# Patient Record
Sex: Male | Born: 2000 | Hispanic: Yes | Marital: Single | Smoking: Heavy tobacco smoker
Health system: Southern US, Community
[De-identification: ages and names within clinical notes are randomized; demographics above are authoritative.]

---

## 2020-10-11 ENCOUNTER — Other Ambulatory Visit: Payer: Self-pay

## 2020-10-11 ENCOUNTER — Emergency Department: Payer: No Typology Code available for payment source

## 2020-10-11 ENCOUNTER — Emergency Department
Admission: EM | Admit: 2020-10-11 | Discharge: 2020-10-11 | Disposition: A | Discharge status: Home / Self Care | Payer: No Typology Code available for payment source | Attending: Emergency Medicine | Admitting: Emergency Medicine

## 2020-10-11 DIAGNOSIS — S40011A Contusion of right shoulder, initial encounter: Secondary | ICD-10-CM | POA: Diagnosis not present

## 2020-10-11 DIAGNOSIS — R457 State of emotional shock and stress, unspecified: Secondary | ICD-10-CM | POA: Diagnosis not present

## 2020-10-11 DIAGNOSIS — T07XXXA Unspecified multiple injuries, initial encounter: Secondary | ICD-10-CM

## 2020-10-11 DIAGNOSIS — S7001XA Contusion of right hip, initial encounter: Secondary | ICD-10-CM | POA: Insufficient documentation

## 2020-10-11 DIAGNOSIS — R238 Other skin changes: Secondary | ICD-10-CM | POA: Insufficient documentation

## 2020-10-11 DIAGNOSIS — S1093XA Contusion of unspecified part of neck, initial encounter: Secondary | ICD-10-CM | POA: Insufficient documentation

## 2020-10-11 DIAGNOSIS — F17210 Nicotine dependence, cigarettes, uncomplicated: Secondary | ICD-10-CM | POA: Diagnosis not present

## 2020-10-11 DIAGNOSIS — S5011XA Contusion of right forearm, initial encounter: Secondary | ICD-10-CM | POA: Diagnosis not present

## 2020-10-11 DIAGNOSIS — Y92410 Unspecified street and highway as the place of occurrence of the external cause: Secondary | ICD-10-CM | POA: Insufficient documentation

## 2020-10-11 DIAGNOSIS — S20221A Contusion of right back wall of thorax, initial encounter: Secondary | ICD-10-CM | POA: Insufficient documentation

## 2020-10-11 DIAGNOSIS — R4589 Other symptoms and signs involving emotional state: Secondary | ICD-10-CM

## 2020-10-11 DIAGNOSIS — S4991XA Unspecified injury of right shoulder and upper arm, initial encounter: Secondary | ICD-10-CM | POA: Diagnosis present

## 2020-10-11 MED ORDER — ALPRAZOLAM 0.5 MG PO TABS: 0.5000 mg | ORAL_TABLET | Freq: Three times a day (TID) | ORAL | 0 refills | Status: DC | PRN

## 2020-10-11 MED ORDER — IBUPROFEN 600 MG PO TABS: 600.0000 mg | ORAL_TABLET | Freq: Four times a day (QID) | ORAL | 0 refills | Status: DC | PRN

## 2020-10-11 NOTE — Discharge Instructions (Signed)
Follow-up with your primary care provider if any continued problems or return to the emergency department if any severe worsening of your symptoms.  The advanced crisis phone number is on a separate paper for you to call and talk with someone if needed.  Medication was sent to your pharmacy for soreness and muscle discomfort.  Also a short-term prescription for anxiety to help you sleep at night was written.  This is to be taken only as directed.  Do not mix alcohol or other medications with this medicine.  Also spent time with friends, listen to music should also help.
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
## 2020-10-11 NOTE — ED Notes (Signed)
See triage note  States he is having pain to right hip area  Ambulates well
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
## 2021-02-17 ENCOUNTER — Other Ambulatory Visit: Payer: Self-pay

## 2021-02-17 ENCOUNTER — Emergency Department: Payer: Self-pay

## 2021-02-17 ENCOUNTER — Emergency Department
Admission: EM | Admit: 2021-02-17 | Discharge: 2021-02-17 | Disposition: A | Discharge status: Home / Self Care | Payer: Self-pay | Attending: Emergency Medicine | Admitting: Emergency Medicine

## 2021-02-17 DIAGNOSIS — F17210 Nicotine dependence, cigarettes, uncomplicated: Secondary | ICD-10-CM | POA: Insufficient documentation

## 2021-02-17 DIAGNOSIS — Z20822 Contact with and (suspected) exposure to covid-19: Secondary | ICD-10-CM | POA: Insufficient documentation

## 2021-02-17 DIAGNOSIS — I951 Orthostatic hypotension: Secondary | ICD-10-CM | POA: Insufficient documentation

## 2021-02-17 DIAGNOSIS — E876 Hypokalemia: Secondary | ICD-10-CM | POA: Insufficient documentation

## 2021-02-17 DIAGNOSIS — Z79899 Other long term (current) drug therapy: Secondary | ICD-10-CM | POA: Insufficient documentation

## 2021-02-17 DIAGNOSIS — F1290 Cannabis use, unspecified, uncomplicated: Secondary | ICD-10-CM | POA: Insufficient documentation

## 2021-02-17 LAB — URINALYSIS, COMPLETE (UACMP) WITH MICROSCOPIC
Bacteria, UA: NONE SEEN
Bilirubin Urine: NEGATIVE
Glucose, UA: NEGATIVE mg/dL
Ketones, ur: NEGATIVE mg/dL
Leukocytes,Ua: NEGATIVE
Nitrite: NEGATIVE
Protein, ur: NEGATIVE mg/dL
Specific Gravity, Urine: 1.013 (ref 1.005–1.030)
Squamous Epithelial / HPF: NONE SEEN (ref 0–5)
pH: 6 (ref 5.0–8.0)

## 2021-02-17 LAB — RESP PANEL BY RT-PCR (FLU A&B, COVID) ARPGX2
Influenza A by PCR: NEGATIVE
Influenza B by PCR: NEGATIVE
SARS Coronavirus 2 by RT PCR: NEGATIVE

## 2021-02-17 LAB — URINE DRUG SCREEN, QUALITATIVE (ARMC ONLY)
Amphetamines, Ur Screen: NOT DETECTED
Barbiturates, Ur Screen: NOT DETECTED
Benzodiazepine, Ur Scrn: NOT DETECTED
Cannabinoid 50 Ng, Ur ~~LOC~~: POSITIVE — AB
Cocaine Metabolite,Ur ~~LOC~~: NOT DETECTED
MDMA (Ecstasy)Ur Screen: NOT DETECTED
Methadone Scn, Ur: NOT DETECTED
Opiate, Ur Screen: NOT DETECTED
Phencyclidine (PCP) Ur S: NOT DETECTED
Tricyclic, Ur Screen: NOT DETECTED

## 2021-02-17 LAB — CBC WITH DIFFERENTIAL/PLATELET
Abs Immature Granulocytes: 0.01 10*3/uL (ref 0.00–0.07)
Basophils Absolute: 0.1 10*3/uL (ref 0.0–0.1)
Basophils Relative: 1 %
Eosinophils Absolute: 0.1 10*3/uL (ref 0.0–0.5)
Eosinophils Relative: 1 %
HCT: 38.2 % — ABNORMAL LOW (ref 39.0–52.0)
Hemoglobin: 13.6 g/dL (ref 13.0–17.0)
Immature Granulocytes: 0 %
Lymphocytes Relative: 32 %
Lymphs Abs: 2.6 10*3/uL (ref 0.7–4.0)
MCH: 32.5 pg (ref 26.0–34.0)
MCHC: 35.6 g/dL (ref 30.0–36.0)
MCV: 91.2 fL (ref 80.0–100.0)
Monocytes Absolute: 0.5 10*3/uL (ref 0.1–1.0)
Monocytes Relative: 7 %
Neutro Abs: 4.9 10*3/uL (ref 1.7–7.7)
Neutrophils Relative %: 59 %
Platelets: 195 10*3/uL (ref 150–400)
RBC: 4.19 MIL/uL — ABNORMAL LOW (ref 4.22–5.81)
RDW: 11.9 % (ref 11.5–15.5)
WBC: 8.1 10*3/uL (ref 4.0–10.5)
nRBC: 0 % (ref 0.0–0.2)

## 2021-02-17 LAB — COMPREHENSIVE METABOLIC PANEL
ALT: 11 U/L (ref 0–44)
AST: 21 U/L (ref 15–41)
Albumin: 4.1 g/dL (ref 3.5–5.0)
Alkaline Phosphatase: 49 U/L (ref 38–126)
Anion gap: 8 (ref 5–15)
BUN: 15 mg/dL (ref 6–20)
CO2: 22 mmol/L (ref 22–32)
Calcium: 8.9 mg/dL (ref 8.9–10.3)
Chloride: 108 mmol/L (ref 98–111)
Creatinine, Ser: 0.84 mg/dL (ref 0.61–1.24)
GFR, Estimated: >60 mL/min (ref >60)
Glucose, Bld: 122 mg/dL — ABNORMAL HIGH (ref 70–99)
Potassium: 2.9 mmol/L — ABNORMAL LOW (ref 3.5–5.1)
Sodium: 138 mmol/L (ref 135–145)
Total Bilirubin: 0.7 mg/dL (ref 0.3–1.2)
Total Protein: 6.5 g/dL (ref 6.5–8.1)

## 2021-02-17 LAB — PROCALCITONIN: Procalcitonin: <0.1 ng/mL

## 2021-02-17 LAB — ETHANOL: Alcohol, Ethyl (B): <10 mg/dL (ref <10)

## 2021-02-17 LAB — TSH: TSH: 1.343 u[IU]/mL (ref 0.350–4.500)

## 2021-02-17 LAB — CK: Total CK: 128 U/L (ref 49–397)

## 2021-02-17 LAB — LACTIC ACID, PLASMA
Lactic Acid, Venous: 2.3 mmol/L (ref 0.5–1.9)
Lactic Acid, Venous: 1.3 mmol/L (ref 0.5–1.9)

## 2021-02-17 LAB — T4, FREE: Free T4: 1.09 ng/dL (ref 0.61–1.12)

## 2021-02-17 MED ORDER — SODIUM CHLORIDE 0.9 % IV BOLUS
1000.0000 mL | Freq: Once | INTRAVENOUS | Status: AC
  Administered 2021-02-17: 1000 mL via INTRAVENOUS

## 2021-02-17 MED ORDER — POTASSIUM CHLORIDE CRYS ER 20 MEQ PO TBCR
40.0000 meq | EXTENDED_RELEASE_TABLET | Freq: Once | ORAL | Status: AC
  Administered 2021-02-17: 40 meq via ORAL
  Filled 2021-02-17: qty 2

## 2021-02-17 NOTE — ED Notes (Signed)
Critical lactic acid 2.3, notified Dr. Dolores Frame. At 0136 Will continue to monitor.

## 2021-02-17 NOTE — ED Provider Notes (Signed)
[facility] Emergency Department Provider Note   ____________________________________________   Event Date/Time   First MD Initiated Contact with Patient 02/17/21 0037     (approximate)  I have reviewed the triage vital signs and the nursing notes.   HISTORY  Chief Complaint Weakness    HPI William Davis is a 20 y.o. male brought to the ED via EMS from friend's house with a chief complaint of generalized malaise, tachycardia, generalized weakness, nausea and orthostasis.  Patient states he has not felt well today.  Admits to marijuana use.  Was at a friend's house when he felt his heart racing.  Denies cough, chest pain, shortness of breath, abdominal pain, vomiting or diarrhea.  Patient was orthostatic for EMS and received 500 cc IV fluids prior to arrival.     Past medical history None  There are no problems to display for this patient.   No past surgical history on file.  Prior to Admission medications   Not on File    Allergies Patient has no known allergies.  No family history on file.  Social History Social History   Tobacco Use   Smoking status: Heavy Smoker    Packs/day: 0.50    Types: Cigarettes   Smokeless tobacco: Never    Review of Systems  Constitutional: Positive for generalized malaise and weakness.  No fever/chills Eyes: No visual changes. ENT: No sore throat. Cardiovascular: Positive for palpitations.  Denies chest pain. Respiratory: Denies shortness of breath. Gastrointestinal: No abdominal pain.  Positive for nausea, no vomiting.  No diarrhea.  No constipation. Genitourinary: Negative for dysuria. Musculoskeletal: Negative for back pain. Skin: Negative for rash. Neurological: Negative for headaches, focal weakness or numbness.   ____________________________________________   PHYSICAL EXAM:  VITAL SIGNS: ED Triage Vitals  Enc Vitals Group     BP      Pulse      Resp      Temp      Temp src       SpO2      Weight      Height      Head Circumference      Peak Flow      Pain Score      Pain Loc      Pain Edu?      Excl. in GC?     Constitutional: Alert and oriented. Well appearing and in mild acute distress. Eyes: Conjunctivae are normal. PERRL. EOMI. Head: Atraumatic. Nose: No congestion/rhinnorhea. Mouth/Throat: Mucous membranes are mildly dry. Neck: No stridor.  No thyromegaly. Cardiovascular: Tachycardic rate, regular rhythm. Grossly normal heart sounds.  Good peripheral circulation. Respiratory: Normal respiratory effort.  No retractions. Lungs CTAB. Gastrointestinal: Soft and nontender to light or deep palpation. No distention. No abdominal bruits. No CVA tenderness. Musculoskeletal: No lower extremity tenderness nor edema.  No joint effusions. Neurologic:  Normal speech and language. No gross focal neurologic deficits are appreciated.  Skin:  Skin is warm, dry and intact. No rash noted.  No petechiae. Psychiatric: Mood and affect are normal. Speech and behavior are normal.  ____________________________________________   LABS (all labs ordered are listed, but only abnormal results are displayed)  Labs Reviewed  CBC WITH DIFFERENTIAL/PLATELET - Abnormal; Notable for the following components:      Result Value   RBC 4.19 (*)    HCT 38.2 (*)    All other components within normal limits  LACTIC ACID, PLASMA - Abnormal; Notable for the following components:   Lactic  Acid, Venous 2.3 (*)    All other components within normal limits  COMPREHENSIVE METABOLIC PANEL - Abnormal; Notable for the following components:   Potassium 2.9 (*)    Glucose, Bld 122 (*)    All other components within normal limits  URINE DRUG SCREEN, QUALITATIVE ([facility] ONLY) - Abnormal; Notable for the following components:   Cannabinoid 50 Ng, Ur [place] POSITIVE (*)    All other components within normal limits  URINALYSIS, COMPLETE (UACMP) WITH MICROSCOPIC - Abnormal; Notable for the following  components:   Color, Urine YELLOW (*)    APPearance CLEAR (*)    Hgb urine dipstick SMALL (*)    All other components within normal limits  RESP PANEL BY RT-PCR (FLU A&B, COVID) ARPGX2  CULTURE, BLOOD (ROUTINE X 2)  CULTURE, BLOOD (ROUTINE X 2)  URINE CULTURE  LACTIC ACID, PLASMA  CK  TSH  T4, FREE  ETHANOL  PROCALCITONIN   ____________________________________________  EKG  ED ECG REPORT I, Blair Lundeen J, the attending physician, personally viewed and interpreted this ECG.   Date: 02/17/2021  EKG Time: 0037  Rate: 97  Rhythm: normal sinus rhythm  Axis: Normal  Intervals:none  ST&T Change: Nonspecific  ____________________________________________  RADIOLOGY I, Galya Dunnigan J, personally viewed and evaluated these images (plain radiographs) as part of my medical decision making, as well as reviewing the written report by the radiologist.  ED MD interpretation: No acute cardiopulmonary process  Official radiology report(s): DG Chest Port 1 View  Result Date: 02/17/2021 CLINICAL DATA:  Pt to ED via EMS from home. Per EMS, Pt HR was in the 130s -140s. Pt hasn't felt good all day and is c/o of nausea and weakness. Pt in sinus tach. PT temp 100.2 axillary. Pt was given 500cc. BP 120/68. CBG 147. Pt is a/ox 4. Pt denies pain. EXAM: PORTABLE CHEST 1 VIEW COMPARISON:  None. FINDINGS: The heart size and mediastinal contours are within normal limits. Hilar vascular prominence. No focal consolidation. Slightly increased interstitial markings. No pleural effusion. No pneumothorax. No acute osseous abnormality. IMPRESSION: Possible mild pulmonary edema. Electronically Signed   By: Tish Frederickson M.D.   On: 02/17/2021 01:15    ____________________________________________   PROCEDURES  Procedure(s) performed (including Critical Care):  .1-3 Lead EKG Interpretation  Date/Time: 02/17/2021 1:05 AM Performed by: Irean Hong, MD Authorized by: Irean Hong, MD     Interpretation: normal      ECG rate:  95   ECG rate assessment: normal     Rhythm: sinus rhythm     Ectopy: none     Conduction: normal   Comments:     Patient placed on cardiac monitor to evaluate for arrhythmias   ____________________________________________   INITIAL IMPRESSION / ASSESSMENT AND PLAN / ED COURSE  As part of my medical decision making, I reviewed the following data within the electronic MEDICAL RECORD NUMBER Nursing notes reviewed and incorporated, Labs reviewed, EKG interpreted, Old chart reviewed, Radiograph reviewed, and Notes from prior ED visits     20 year old male presenting with generalized malaise, weakness, tachycardia, orthostasis and nausea.  Differential diagnosis includes but is not limited to dehydration, rhabdomyolysis, ACS, thyroid disorder, infectious, metabolic etiologies, etc.  Will obtain lab work, UA, chest x-ray, COVID swab.  Continue IV fluid resuscitation and reassess.  Clinical Course as of 02/17/21 0455  Wynelle Link Feb 17, 2021  0139 Noted lactate. Will add blood cultures, UA, urine culture, procalcitonin. Unclear history of sepsis. [JS]  [id number] Patient states he is feeling  significantly better.  Currently texting on cell phone.  Awaiting rest of lab work. [JS]  0311 Lactic acid has normalized after IV fluids.  Awaiting results of procalcitonin. [JS]  0427 Procalcitonin negative.  Strict return precautions given.  Patient verbalizes understanding agrees with plan of care. [JS]    Clinical Course User Index [JS] Irean Hong, MD     ____________________________________________   FINAL CLINICAL IMPRESSION(S) / ED DIAGNOSES  Final diagnoses:  Hypokalemia  Marijuana use  Orthostasis     ED Discharge Orders     None        Note:  This document was prepared using Dragon voice recognition software and may include unintentional dictation errors.    Irean Hong, MD 02/17/21 [phone]

## 2021-02-17 NOTE — Discharge Instructions (Addendum)
Drink plenty of fluids daily.  Return to the ER for worsening symptoms, persistent vomiting, difficulty breathing or other concerns. °

## 2021-02-17 NOTE — ED Triage Notes (Signed)
Pt to ED via EMS from home. Per EMS, Pt HR was in the 130s -140s. Pt hasn't felt good all day and is c/o of nausea and weakness. Pt in sinus tach. PT temp 100.2 axillary. Pt was given 500cc. BP 120/68. CBG 147. Pt is a/ox 4.  Pt denies pain.

## 2021-02-17 NOTE — ED Notes (Signed)
Called lab to inquire about procalcitonin level, tech states she will run it now.

## 2021-02-18 LAB — URINE CULTURE: Culture: NO GROWTH

## 2021-02-22 LAB — CULTURE, BLOOD (ROUTINE X 2)
Culture: NO GROWTH
Culture: NO GROWTH
Special Requests: ADEQUATE
Special Requests: ADEQUATE

## 2021-04-18 ENCOUNTER — Other Ambulatory Visit: Payer: Self-pay

## 2021-04-18 ENCOUNTER — Ambulatory Visit (HOSPITAL_COMMUNITY)
Admission: EM | Admit: 2021-04-18 | Discharge: 2021-04-18 | Disposition: A | Discharge status: Home / Self Care | Payer: Self-pay | Attending: Psychiatry | Admitting: Psychiatry

## 2021-04-18 DIAGNOSIS — R45851 Suicidal ideations: Secondary | ICD-10-CM | POA: Insufficient documentation

## 2021-04-18 DIAGNOSIS — F4323 Adjustment disorder with mixed anxiety and depressed mood: Secondary | ICD-10-CM | POA: Insufficient documentation
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
## 2021-04-18 NOTE — Discharge Instructions (Addendum)
Patient is instructed prior to discharge to:  Take all medications as prescribed by his/her mental healthcare provider. Report any adverse effects and or reactions from the medicines to his/her outpatient provider promptly. Keep all scheduled appointments, to ensure that you are getting refills on time and to avoid any interruption in your medication.  If you are unable to keep an appointment call to reschedule.  Be sure to follow-up with resources and follow-up appointments provided.  Patient has been instructed & cautioned: To not engage in alcohol and or illegal drug use while on prescription medicines. In the event of worsening symptoms, patient is instructed to call the crisis hotline, 911 and or go to the nearest ED for appropriate evaluation and treatment of symptoms. To follow-up with his/her primary care provider for your other medical issues, concerns and or health care needs.   Patient was given outpatient resources for:  [facility] [address] [place], [place] [address] Phone: [phone]  [facility] [address], [place], [place] [address] Phone: [phone]

## 2021-04-18 NOTE — Discharge Summary (Signed)
William Davis to be D/C'd Home per FNP order. Pt left facility before he was given his AVS. Patient is discharged at this time. [name]  04/18/2021 6:14 PM

## 2021-04-25 ENCOUNTER — Telehealth (HOSPITAL_COMMUNITY): Payer: Self-pay

## 2021-04-25 NOTE — BH Assessment (Signed)
Care Management - Follow Up [facility] Discharges   Writer attempted to make contact with patient today and was unsuccessful.  Writer was not able to leave a HIPPA compliant voice message because the patient voice mail box is full.   
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
